# Patient Record
Sex: Male | Born: 1975 | Race: White | Hispanic: No | Marital: Single | Smoking: Never smoker
Health system: Southern US, Community
[De-identification: ages and names within clinical notes are randomized; demographics above are authoritative.]

## PROBLEM LIST (undated history)

## (undated) HISTORY — PX: VASECTOMY: SHX75

---

## 1998-10-10 ENCOUNTER — Encounter: Payer: Self-pay | Admitting: Emergency Medicine

## 1998-10-10 ENCOUNTER — Emergency Department (HOSPITAL_COMMUNITY): Admission: EM | Admit: 1998-10-10 | Discharge: 1998-10-10 | Payer: Self-pay | Admitting: Emergency Medicine

## 2001-06-14 ENCOUNTER — Emergency Department (HOSPITAL_COMMUNITY): Admission: AC | Admit: 2001-06-14 | Discharge: 2001-06-15 | Payer: Self-pay

## 2001-06-14 ENCOUNTER — Encounter: Payer: Self-pay | Admitting: Emergency Medicine

## 2010-01-11 ENCOUNTER — Emergency Department: Payer: Self-pay | Admitting: Emergency Medicine

## 2011-01-06 ENCOUNTER — Emergency Department (HOSPITAL_COMMUNITY): Payer: PRIVATE HEALTH INSURANCE

## 2011-01-06 ENCOUNTER — Observation Stay (HOSPITAL_COMMUNITY): Payer: PRIVATE HEALTH INSURANCE

## 2011-01-06 ENCOUNTER — Inpatient Hospital Stay (HOSPITAL_COMMUNITY)
Admission: EM | Admit: 2011-01-06 | Discharge: 2011-01-07 | DRG: 089 | Disposition: A | Discharge status: Home / Self Care | Payer: PRIVATE HEALTH INSURANCE | Attending: Surgery | Admitting: Surgery

## 2011-01-06 DIAGNOSIS — W138XXA Fall from, out of or through other building or structure, initial encounter: Secondary | ICD-10-CM | POA: Diagnosis present

## 2011-01-06 DIAGNOSIS — S060X1A Concussion with loss of consciousness of 30 minutes or less, initial encounter: Principal | ICD-10-CM | POA: Diagnosis present

## 2011-01-06 DIAGNOSIS — S270XXA Traumatic pneumothorax, initial encounter: Secondary | ICD-10-CM

## 2011-01-06 DIAGNOSIS — S22009A Unspecified fracture of unspecified thoracic vertebra, initial encounter for closed fracture: Secondary | ICD-10-CM | POA: Diagnosis present

## 2011-01-06 DIAGNOSIS — S2239XA Fracture of one rib, unspecified side, initial encounter for closed fracture: Secondary | ICD-10-CM | POA: Diagnosis present

## 2011-01-06 DIAGNOSIS — S27329A Contusion of lung, unspecified, initial encounter: Secondary | ICD-10-CM | POA: Diagnosis present

## 2011-01-06 LAB — POCT I-STAT, CHEM 8
BUN: 23 mg/dL (ref 6–23)
Creatinine, Ser: 1.3 mg/dL (ref 0.50–1.35)
Potassium: 2.9 mEq/L — ABNORMAL LOW (ref 3.5–5.1)
Sodium: 140 mEq/L (ref 135–145)

## 2011-01-06 LAB — URINALYSIS, ROUTINE W REFLEX MICROSCOPIC
Glucose, UA: NEGATIVE mg/dL
Hgb urine dipstick: NEGATIVE
Specific Gravity, Urine: 1.025 (ref 1.005–1.030)

## 2011-01-07 ENCOUNTER — Inpatient Hospital Stay (HOSPITAL_COMMUNITY): Payer: PRIVATE HEALTH INSURANCE

## 2011-01-13 ENCOUNTER — Telehealth (INDEPENDENT_AMBULATORY_CARE_PROVIDER_SITE_OTHER): Payer: Self-pay | Admitting: Physician Assistant

## 2011-01-13 NOTE — Telephone Encounter (Signed)
Gary Cook called for refill of Percocet as they will not be seeing Dr. Phoebe Perch until 01/29/11.  Refilled Percocet 5/325mg  # 80 1-2 po q4hrs prn pain

## 2011-01-13 NOTE — Telephone Encounter (Signed)
Left message

## 2011-01-26 NOTE — Discharge Summary (Signed)
  Gary Cook, Gary Cook                   ACCOUNT NO.:  [id number]  MEDICAL RECORD NO.:  [id number]  LOCATION:  5012                         FACILITY:  [facility]  PHYSICIAN:  Gabrielle Dare. Janee Morn, M.D.DATE OF BIRTH:  02-11-1976  DATE OF ADMISSION:  01/06/2011 DATE OF DISCHARGE:  01/07/2011                              DISCHARGE SUMMARY   ADMITTING TRAUMA SURGEON:  Cherylynn Ridges, MD  CONSULTANTS:  Dr. Phoebe Perch, Neurosurgery.  DISCHARGE DIAGNOSES: 1. Fall from a roof. 2. T10-T12 compression fractures. 3. Question left first rib fracture with small pulmonary contusion and     tiny apical pneumothorax. 4. Concussion with brief loss of consciousness.  HISTORY ON ADMISSION:  This is a 34 year old male who reportedly fell from the roof of a shed approximately 9 feet.  He was brought in as a non-trauma code activation.  Workup at this time including a chest x-ray showed a small left apical pulmonary contusion versus atelectasis.  T and L-spine plain films showed T10-T12 compression fractures.  CT scan of the C-spine showed a left first rib fracture.  Chest, abdomen and pelvic CT scans with detail of the thoracolumbar spine again revealed T10-T12 compression fractures.  The patient was admitted due to inadequate pain control.  He was seen by Dr. Phoebe Perch and mobilized in a TLSO brace.  The patient will follow up with Dr. Phoebe Perch in 3-4 weeks.  The patient did mobilize well with physical therapy and his TLSO brace and at this time is ready for discharge home.  MEDICATIONS:  At the time of discharge include Percocet 5/325 mg one to two p.o. q.4 h. p.r.n. pain #80 no refill and Zofran as needed for nausea and vomiting.  FOLLOWUP:  Again, the patient will follow up with Dr. Phoebe Perch in 3-4 weeks.  Follow up with Trauma Service will be on an as-needed basis.     Shawn Rayburn, P.A.   ______________________________ Gabrielle Dare Janee Morn, M.D.    SR/MEDQ  D:  01/07/2011  T:  01/07/2011  Job:   [id number]  cc:   [facility]  Electronically Signed by Lazaro Arms P.A. on 01/15/2011 05:42:18 PM Electronically Signed by Violeta Gelinas M.D. on 01/26/2011 01:26:33 PM

## 2011-01-27 ENCOUNTER — Telehealth (INDEPENDENT_AMBULATORY_CARE_PROVIDER_SITE_OTHER): Payer: Self-pay | Admitting: Physician Assistant

## 2011-01-27 MED ORDER — OXYCODONE-ACETAMINOPHEN 5-325 MG PO TABS: 1.0000 | ORAL_TABLET | ORAL | Status: AC | PRN

## 2011-01-27 NOTE — Telephone Encounter (Signed)
Gary Cook calls to ask for few more Percocets until he sees Dr. Phoebe Perch on 10/19. I will give Percocet as above #20 No further refills.

## 2014-07-07 ENCOUNTER — Emergency Department: Payer: Self-pay | Admitting: Physician Assistant

## 2017-01-17 ENCOUNTER — Ambulatory Visit (INDEPENDENT_AMBULATORY_CARE_PROVIDER_SITE_OTHER): Payer: Self-pay | Admitting: Physician Assistant

## 2017-01-17 ENCOUNTER — Encounter: Payer: Self-pay | Admitting: Physician Assistant

## 2017-01-17 VITALS — BP 128/66 | HR 78 | Temp 97.8°F | Resp 14 | Ht 69.0 in | Wt 182.0 lb

## 2017-01-17 DIAGNOSIS — G4700 Insomnia, unspecified: Secondary | ICD-10-CM

## 2017-01-17 DIAGNOSIS — Z0000 Encounter for general adult medical examination without abnormal findings: Secondary | ICD-10-CM

## 2017-01-17 DIAGNOSIS — F411 Generalized anxiety disorder: Secondary | ICD-10-CM

## 2017-01-17 MED ORDER — CLONAZEPAM 0.5 MG PO TABS: 0.5000 mg | ORAL_TABLET | Freq: Two times a day (BID) | ORAL | 0 refills | Status: DC | PRN

## 2017-01-17 MED ORDER — ESCITALOPRAM OXALATE 10 MG PO TABS: 10.0000 mg | ORAL_TABLET | Freq: Every day | ORAL | 2 refills | Status: DC

## 2017-01-17 NOTE — Progress Notes (Signed)
Patient ID: Gary Cook MRN: [id number], DOB: 1975-09-02, 41 y.o. Date of Encounter: @  Chief Complaint:  Chief Complaint  Patient presents with  . New Patient~ Establish Care    is not fasting  . Increased Anxiety    HPI: 41 y.o. year old male  presents as a New Patient, as above.   He reports that he "never goes to the doctor ". Notes that his work keeps encouraging wellness visits for health screenings.  Reports that he has been getting very little sleep for several months now. Says that he has no problems falling asleep but is having a lot of difficulty staying asleep. Says that he frequently is awake at 12:00, 2:00, 3:00. Reports that there has been increased stress at work for about 3 months. Reports that his child's mother has recently been diagnosed with cancer. Later in the conversation I asked more about this. He says that she had surgery this past week. Asked what area the cancer is in and whether they think she is going to need additional treatments or just the surgery. He states that she is remarried and thinks that she actually was trying to get pregnant and that's when was found to have cancer "in girl parts." He thinks she is just been a have to have the surgery and hopefully no other treatments.  He is very difficult to talk to and very difficult to get much information from.  I then tried to get more of an idea about his job and his stress at work. I note that he is wearing a shirt that says [employer]. 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 States that some days he is in the office all day and other days he [place] be driving most of the day. Other days it is a mix of office and driving. Says it just depends on the day. Says that he has been with this job for 20 years. I asked about stressors there-- he says that some clients are difficult. Also adds that "they're  always expecting more and more".   I then asked whether he feels that if he was getting enough sleep/ had medication to help with sleep--- if that is the underlying issue or if he feels that there is an underlying problem with anxiety.  He says that frequently here recently he feels like if he doesn't take a deep breath and take a moment that he "will lose it". Says that he does feel anxious and on edge all the time recently. Says this is new for him just over the past 3 months. Says that he used to be the person that would make other people laugh.  Says now he really doesn't want to go to work. Dreads going to work.  No other concerns to address today.   History reviewed. No pertinent past medical history.   Home Meds: No outpatient prescriptions prior to visit.   No facility-administered medications prior to visit.     Allergies: No Known Allergies  Social History   Social History  . Marital status: Unknown    Spouse name: N/A  . Number of children: N/A  . Years of education: N/A   Occupational History  . Not on file.   Social History Main Topics  . Smoking status: Never Smoker  . Smokeless tobacco: Never Used  . Alcohol use 3.6 oz/week    6 Cans of beer  per week  . Drug use: No  . Sexual activity: Yes    Birth control/ protection: Surgical   Other Topics Concern  . Not on file   Social History Narrative  . No narrative on file    Family History  Problem Relation Age of Onset  . Cancer Mother        breast  . Arthritis Father   . Cancer Father        prostate/ leukemia  . Arthritis Paternal Grandmother      Review of Systems:  See HPI for pertinent ROS. All other ROS negative.    Physical Exam: Blood pressure 128/66, pulse 78, temperature 97.8 F (36.6 C), temperature source Oral, resp. rate 14, height  (1.753 m), weight 82.6 kg (182 lb), SpO2 96 %., Body mass index is 26.88 kg/m. General: WNWD WM. Appears in no acute distress. Neck: Supple. No  thyromegaly. No lymphadenopathy. Lungs: Clear bilaterally to auscultation without wheezes, rales, or rhonchi. Breathing is unlabored. Heart: RRR with S1 S2. No murmurs, rubs, or gallops. Musculoskeletal:  Strength and tone normal for age. Extremities/Skin: Warm and dry.  Neuro: Alert and oriented X 3. Moves all extremities spontaneously. Gait is normal. CNII-XII grossly in tact. Psych:  Responds to questions appropriately with a normal affect. Very stoic. Very difficult to get information from. Keeps saying "yes mam" to me.      ASSESSMENT AND PLAN:  41 y.o. year old male with  1. Encounter for medical examination to establish care Once we get his anxiety and insomnia controlled, will discuss scheduling CPE.  2. Generalized anxiety disorder Discussed proper expectations of these medications and proper use of them. Discussed that the Lexapro will take weeks to take effect. Therefore if he does not think that it is necessarily helping to just continue taking it until he follows up with me in 6 weeks. However, if he thinks that this is causing adverse effects then stop the medication and go ahead and call and inform me. Discussed proper expectations and use of the Klonopin. He is to take 1 every night to see if this helps with insomnia. He is to have trial for 2 or 3 nights. After that if he is still not getting sleep he is to go ahead and call me so I can prescribe a different medication such as Ambien to use for sleep. He can also use this during the day as needed for significant anxiety symptoms. Discussed not to take prior to driving and to take trial dose at home to see how it affects him prior to taking this at work etc. He voices understanding and agrees regarding proper use of these medications and to go ahead and call me if he thinks they're causing any adverse effects or if he is not getting adequate sleep in the next few days. - escitalopram (LEXAPRO) 10 MG tablet; Take 1 tablet (10 mg  total) by mouth daily.  Dispense: 30 tablet; Refill: 2 - clonazePAM (KLONOPIN) 0.5 MG tablet; Take 1 tablet (0.5 mg total) by mouth 2 (two) times daily as needed for anxiety.  Dispense: 60 tablet; Refill: 0  3. Insomnia, unspecified type - clonazePAM (KLONOPIN) 0.5 MG tablet; Take 1 tablet (0.5 mg total) by mouth 2 (two) times daily as needed for anxiety.  Dispense: 60 tablet; Refill: 0  Follow-up visit in 6 weeks or sooner if needed. Once I get anxiety and insomnia controlled and will discuss scheduling CPE.  Signed, Shon [place], [place], [place]  01/17/2017 4:30 PM

## 2017-02-28 ENCOUNTER — Ambulatory Visit (INDEPENDENT_AMBULATORY_CARE_PROVIDER_SITE_OTHER): Payer: 59 | Admitting: Physician Assistant

## 2017-02-28 ENCOUNTER — Other Ambulatory Visit: Payer: Self-pay

## 2017-02-28 ENCOUNTER — Encounter: Payer: Self-pay | Admitting: Physician Assistant

## 2017-02-28 VITALS — BP 120/84 | HR 88 | Temp 97.6°F | Resp 16 | Wt 193.6 lb

## 2017-02-28 DIAGNOSIS — G4700 Insomnia, unspecified: Secondary | ICD-10-CM

## 2017-02-28 DIAGNOSIS — F411 Generalized anxiety disorder: Secondary | ICD-10-CM | POA: Diagnosis not present

## 2017-02-28 MED ORDER — CLONAZEPAM 0.5 MG PO TABS: 0.5000 mg | ORAL_TABLET | Freq: Two times a day (BID) | ORAL | 0 refills | Status: DC | PRN

## 2017-02-28 MED ORDER — ESCITALOPRAM OXALATE 20 MG PO TABS: 20.0000 mg | ORAL_TABLET | Freq: Every day | ORAL | 1 refills | Status: DC

## 2017-02-28 NOTE — Progress Notes (Signed)
Patient ID: Iniko Mccay MRN: [id number], DOB: July 22, 1975, 41 y.o. Date of Encounter: @DATE @  Chief Complaint:  Chief Complaint  Patient presents with  . 6 week follow up    HPI: 41 y.o. year old male     01/17/2017:  presents as a New Patient, as above.   He reports that he "never goes to the doctor ". Notes that his work keeps encouraging wellness visits for health screenings.  Reports that he has been getting very little sleep for several months now. Says that he has no problems falling asleep but is having a lot of difficulty staying asleep. Says that he frequently is awake at 12:00, 2:00, 3:00. Reports that there has been increased stress at work for about 3 months. Reports that his child's mother has recently been diagnosed with cancer. Later in the conversation I asked more about this. He says that she had surgery this past week. Asked what area the cancer is in and whether they think she is going to need additional treatments or just the surgery. He states that she is remarried and thinks that she actually was trying to get pregnant and that's when was found to have cancer "in girl parts." He thinks she is just been a have to have the surgery and hopefully no other treatments.  He is very difficult to talk to and very difficult to get much information from.  I then tried to get more of an idea about his job and his stress at work. I note that he is wearing a shirt that says [employer]. 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 States that some days he is in the office all day and other days he [place] be driving most of the day. Other days it is a mix of office and driving. Says it just depends on the day. Says that he has been with this job for 20 years. I asked about stressors there-- he says that some clients are difficult. Also adds that "they're always expecting more and more".    I then asked whether he feels that if he was getting enough sleep/ had medication to help with sleep--- if that is the underlying issue or if he feels that there is an underlying problem with anxiety.  He says that frequently here recently he feels like if he doesn't take a deep breath and take a moment that he "will lose it". Says that he does feel anxious and on edge all the time recently. Says this is new for him just over the past 3 months. Says that he used to be the person that would make other people laugh.  Says now he really doesn't want to go to work. Dreads going to work.  No other concerns to address today.  AT THAT OV: I prescribed Lexapro 10 mg daily.  Also prescribed clonazepam 0.5 mg 1 p.o. twice daily as needed.  60+0.    02/28/2017: Today patient states that he definitely is feeling better than he did 6 weeks ago at last visit. States that he has been sleeping better.  Does still wake up some but not as much.  He does not wake as often-- and also does not stay awake as long as he was in past---is able to fall back to sleep. Says that in the morning he takes the Lexapro and also goes ahead and takes the clonazepam along with it.  Feels like if he did not take the Klonopin, he would still feel anxious/on edge so has been taking that each morning to prevent getting so upset/stressed out.  Has not been taking Klonopin at night.  He feels that he is sleeping better just because of the Lexapro.  Has been on vacation for the past 10 days so has been off work but is returning to work today.  Lexapro is causing no adverse effects.    No past medical history on file.   Home Meds: Outpatient Medications Prior to Visit  Medication Sig Dispense Refill  . Melatonin 5 MG TABS Take 5 mg by mouth.     . Multiple Vitamin (MULTIVITAMIN WITH MINERALS) TABS tablet Take 1 tablet by mouth daily.    . clonazePAM (KLONOPIN) 0.5 MG tablet Take 1 tablet (0.5 mg total) by mouth 2 (two) times daily  as needed for anxiety. 60 tablet 0  . escitalopram (LEXAPRO) 10 MG tablet Take 1 tablet (10 mg total) by mouth daily. 30 tablet 2   No facility-administered medications prior to visit.     Allergies: No Known Allergies  Social History   Socioeconomic History  . Marital status: Unknown    Spouse name: Not on file  . Number of children: Not on file  . Years of education: Not on file  . Highest education level: Not on file  Social Needs  . Financial resource strain: Not on file  . Food insecurity - worry: Not on file  . Food insecurity - inability: Not on file  . Transportation needs - medical: Not on file  . Transportation needs - non-medical: Not on file  Occupational History  . Not on file  Tobacco Use  . Smoking status: Never Smoker  . Smokeless tobacco: Never Used  Substance and Sexual Activity  . Alcohol use: Yes    Alcohol/week: 3.6 oz    Types: 6 Cans of beer per week  . Drug use: No  . Sexual activity: Yes    Birth control/protection: Surgical  Other Topics Concern  . Not on file  Social History Narrative  . Not on file    Family History  Problem Relation Age of Onset  . Cancer Mother        breast  . Arthritis Father   . Cancer Father        prostate/ leukemia  . Arthritis Paternal Grandmother      Review of Systems:  See HPI for pertinent ROS. All other ROS negative.    Physical Exam: Blood pressure 120/84, pulse 88, temperature 97.6 F (36.4 C), temperature source Oral, resp. rate 16, weight 87.8 kg (193 lb 9.6 oz), SpO2 98 %., Body mass index is 28.59 kg/m. General: WNWD WM. Appears in no acute distress. Neck: Supple. No thyromegaly. No lymphadenopathy. Lungs: Clear bilaterally to auscultation without wheezes, rales, or rhonchi. Breathing is unlabored. Heart: RRR with S1 S2. No murmurs, rubs, or gallops. Musculoskeletal:  Strength and tone normal for age. Extremities/Skin: Warm and dry.  Neuro: Alert and oriented X 3. Moves all extremities  spontaneously. Gait is normal. CNII-XII grossly in tact. Psych:  Responds to questions appropriately with a normal affect. Very stoic. Very difficult to get information from. Keeps saying "yes mam" to me.      ASSESSMENT AND PLAN:  41 y.o. year old male with   1. Insomnia, unspecified type Increase Lexapro to 20 mg daily.  Continue taking Klonopin as needed.  Follow-up visit 6-8 weeks. - escitalopram (  LEXAPRO) 20 MG tablet; Take 1 tablet (20 mg total) daily by mouth.  Dispense: 30 tablet; Refill: 1 - clonazePAM (KLONOPIN) 0.5 MG tablet; Take 1 tablet (0.5 mg total) 2 (two) times daily as needed by mouth for anxiety.  Dispense: 60 tablet; Refill: 0  2. Generalized anxiety disorder Increase Lexapro to 20 mg daily.  Continue taking Klonopin as needed.  Follow-up visit 6-8 weeks. - escitalopram (LEXAPRO) 20 MG tablet; Take 1 tablet (20 mg total) daily by mouth.  Dispense: 30 tablet; Refill: 1 - clonazePAM (KLONOPIN) 0.5 MG tablet; Take 1 tablet (0.5 mg total) 2 (two) times daily as needed by mouth for anxiety.  Dispense: 60 tablet; Refill: 0    Once we get his anxiety and insomnia controlled, will discuss scheduling CPE.  Follow-up visit in 6 weeks or sooner if needed. Once I get anxiety and insomnia controlled and will discuss scheduling CPE.  Signed, [address]Ariana Juul Beth Oakv[place], [place], [facility] 02/28/2017 11:48 AM

## 2017-04-11 ENCOUNTER — Ambulatory Visit: Payer: 59 | Admitting: Physician Assistant

## 2018-05-03 ENCOUNTER — Telehealth: Payer: Self-pay | Admitting: Family Medicine

## 2018-05-03 NOTE — Telephone Encounter (Signed)
Pt has shingles and is being treated with Valtrex by teledoc but would like something for the pain.

## 2018-05-04 ENCOUNTER — Other Ambulatory Visit: Payer: Self-pay | Admitting: Family Medicine

## 2018-05-04 MED ORDER — HYDROCODONE-ACETAMINOPHEN 5-325 MG PO TABS: 1.0000 | ORAL_TABLET | Freq: Four times a day (QID) | ORAL | 0 refills | Status: DC | PRN

## 2018-05-04 NOTE — Telephone Encounter (Signed)
Pt aware.

## 2018-05-04 NOTE — Telephone Encounter (Signed)
I sent norco to his pharmacy.

## 2018-10-05 ENCOUNTER — Encounter: Payer: Self-pay | Admitting: Family Medicine

## 2018-10-05 ENCOUNTER — Ambulatory Visit (INDEPENDENT_AMBULATORY_CARE_PROVIDER_SITE_OTHER): Payer: Commercial Managed Care - PPO | Admitting: Family Medicine

## 2018-10-05 ENCOUNTER — Other Ambulatory Visit: Payer: Self-pay

## 2018-10-05 VITALS — BP 112/66 | HR 76 | Temp 98.3°F | Resp 14 | Ht 69.0 in | Wt 194.0 lb

## 2018-10-05 DIAGNOSIS — B0229 Other postherpetic nervous system involvement: Secondary | ICD-10-CM | POA: Diagnosis not present

## 2018-10-05 MED ORDER — VALACYCLOVIR HCL 1 G PO TABS: 1000.0000 mg | ORAL_TABLET | Freq: Three times a day (TID) | ORAL | 0 refills | Status: DC

## 2018-10-05 MED ORDER — GABAPENTIN 300 MG PO CAPS: 300.0000 mg | ORAL_CAPSULE | Freq: Three times a day (TID) | ORAL | 3 refills | Status: DC

## 2018-10-05 NOTE — Progress Notes (Signed)
Subjective:    Patient ID: Gary Cook, male    DOB: 05/28/1975, 43 y.o.   MRN: [id number]  HPI  Patient had shingles in a dermatomal distribution on his right flank roughly in the area of the kidney radiating around to his right abdomen in January.  Patient states it was severe.  It was a red rash with numerous blisters that burn and hurt.  He states that that was the second time he had had "shingles" in that exact same area.  He states that 3 days ago he started having the same symptoms of burning stinging pain in that area.  There is no visible rash.  The area diagrammed on the physical exam represents the distribution of the pain  No past medical history on file. Past Surgical History:  Procedure Laterality Date  . VASECTOMY     Current Outpatient Medications on File Prior to Visit  Medication Sig Dispense Refill  . clonazePAM (KLONOPIN) 0.5 MG tablet Take 1 tablet (0.5 mg total) 2 (two) times daily as needed by mouth for anxiety. 60 tablet 0  . escitalopram (LEXAPRO) 20 MG tablet Take 1 tablet (20 mg total) daily by mouth. 30 tablet 1  . HYDROcodone-acetaminophen (NORCO) 5-325 MG tablet Take 1 tablet by mouth every 6 (six) hours as needed for moderate pain. 30 tablet 0  . Melatonin 5 MG TABS Take 5 mg by mouth.     . Multiple Vitamin (MULTIVITAMIN WITH MINERALS) TABS tablet Take 1 tablet by mouth daily.     No current facility-administered medications on file prior to visit.    No Known Allergies Social History   Socioeconomic History  . Marital status: Unknown    Spouse name: Not on file  . Number of children: Not on file  . Years of education: Not on file  . Highest education level: Not on file  Occupational History  . Not on file  Social Needs  . Financial resource strain: Not on file  . Food insecurity    Worry: Not on file    Inability: Not on file  . Transportation needs    Medical: Not on file    Non-medical: Not on file  Tobacco Use  . Smoking status: Never  Smoker  . Smokeless tobacco: Never Used  Substance and Sexual Activity  . Alcohol use: Yes    Alcohol/week: 6.0 standard drinks    Types: 6 Cans of beer per week  . Drug use: No  . Sexual activity: Yes    Birth control/protection: Surgical  Lifestyle  . Physical activity    Days per week: Not on file    Minutes per session: Not on file  . Stress: Not on file  Relationships  . Social Musicianconnections    Talks on phone: Not on file    Gets together: Not on file    Attends religious service: Not on file    Active member of club or organization: Not on file    Attends meetings of clubs or organizations: Not on file    Relationship status: Not on file  . Intimate partner violence    Fear of current or ex partner: Not on file    Emotionally abused: Not on file    Physically abused: Not on file    Forced sexual activity: Not on file  Other Topics Concern  . Not on file  Social History Narrative  . Not on file     Review of Systems  All other systems  reviewed and are negative.      Objective:   Physical Exam Vitals signs reviewed.  Constitutional:      Appearance: Normal appearance.  Cardiovascular:     Rate and Rhythm: Normal rate and regular rhythm.     Pulses: Normal pulses.     Heart sounds: Normal heart sounds. No murmur.  Pulmonary:     Effort: Pulmonary effort is normal. No respiratory distress.     Breath sounds: Normal breath sounds. No stridor. No wheezing, rhonchi or rales.  Abdominal:     General: Abdomen is flat. Bowel sounds are normal.     Palpations: Abdomen is soft.    Musculoskeletal:       Back:     Right lower leg: No edema.     Left lower leg: No edema.  Neurological:     Mental Status: He is alert.           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 Patient has no visible rash but pain that he describes certainly sounds neuropathic.  This is either postherpetic neuralgia where the patient is about to suffer  another outbreak.  We will treat the patient with Valtrex 1 g p.o. 3 times daily for 7 days with gabapentin.  The patient continues to get outbreaks in the same area I would recommend a viral culture to differentiate between shingles versus herpes.  If there is no rash with the patient continues to experience pain in that area I will treat him simply with gabapentin in the future for postherpetic neuralgia.  Spent 20 minutes with the patient today explaining this and the difference.

## 2019-09-11 ENCOUNTER — Ambulatory Visit
Admission: RE | Admit: 2019-09-11 | Discharge: 2019-09-11 | Disposition: A | Discharge status: Home / Self Care | Payer: Commercial Managed Care - PPO | Source: Ambulatory Visit | Attending: Family Medicine | Admitting: Family Medicine

## 2019-09-11 ENCOUNTER — Other Ambulatory Visit: Payer: Self-pay

## 2019-09-11 ENCOUNTER — Ambulatory Visit (INDEPENDENT_AMBULATORY_CARE_PROVIDER_SITE_OTHER): Payer: Commercial Managed Care - PPO | Admitting: Family Medicine

## 2019-09-11 VITALS — BP 134/70 | HR 76 | Temp 97.1°F | Resp 14 | Ht 69.0 in | Wt 202.0 lb

## 2019-09-11 DIAGNOSIS — M5432 Sciatica, left side: Secondary | ICD-10-CM | POA: Diagnosis not present

## 2019-09-11 NOTE — Progress Notes (Signed)
Subjective:    Patient ID: Gary Cook, male    DOB: 08-28-1975, 44 y.o.   MRN: [id number]  HPI  Patient is a very pleasant 44 year old Caucasian male who presents today complaining of left-sided sciatica.  About 10 years ago, he fell off the roof of a barn and injured himself severely when he hit the ground.  He never had any x-rays taken of his lower back.  Since that time he has had occasional pain radiating down his left leg.  The pain starts in his gluteus area and radiates all the way into his left foot.  Over the last several months however the pain has become intense.  He states that a week ago he was unable to leave the house due to the pain in his lower back radiating down his left leg.  He is carrying his weight awkwardly on his right leg to try to compensate and this is leading to pain over the lateral aspect of his right foot particular over the metatarsal and arch.  He describes a shooting burning pain going down his left leg.  He denies any numbness in his crotch or saddle anesthesia.  He denies any bowel or bladder incontinence.  He denies any rash down his leg.  He has been taking gabapentin which I initially gave him for shingles for the pain and it does not help No past medical history on file. Past Surgical History:  Procedure Laterality Date  . VASECTOMY     Current Outpatient Medications on File Prior to Visit  Medication Sig Dispense Refill  . gabapentin (NEURONTIN) 300 MG capsule Take 1 capsule (300 mg total) by mouth 3 (three) times daily. 90 capsule 3  . HYDROcodone-acetaminophen (NORCO) 5-325 MG tablet Take 1 tablet by mouth every 6 (six) hours as needed for moderate pain. (Patient not taking: Reported on 10/05/2018) 30 tablet 0  . Multiple Vitamin (MULTIVITAMIN WITH MINERALS) TABS tablet Take 1 tablet by mouth daily.    . valACYclovir (VALTREX) 1000 MG tablet Take 1 tablet (1,000 mg total) by mouth 3 (three) times daily. 21 tablet 0   No current facility-administered  medications on file prior to visit.   No Known Allergies Social History   Socioeconomic History  . Marital status: Unknown    Spouse name: Not on file  . Number of children: Not on file  . Years of education: Not on file  . Highest education level: Not on file  Occupational History  . Not on file  Tobacco Use  . Smoking status: Never Smoker  . Smokeless tobacco: Never Used  Substance and Sexual Activity  . Alcohol use: Yes    Alcohol/week: 6.0 standard drinks    Types: 6 Cans of beer per week  . Drug use: No  . Sexual activity: Yes    Birth control/protection: Surgical  Other Topics Concern  . Not on file  Social History Narrative  . Not on file   Social Determinants of Health   Financial Resource Strain:   . Difficulty of Paying Living Expenses:   Food Insecurity:   . Worried About Programme researcher, broadcasting/film/video in the Last Year:   . Barista in the Last Year:   Transportation Needs:   . Freight forwarder (Medical):   Marland Kitchen Lack of Transportation (Non-Medical):   Physical Activity:   . Days of Exercise per Week:   . Minutes of Exercise per Session:   Stress:   . Feeling of Stress :  Social Connections:   . Frequency of Communication with Friends and Family:   . Frequency of Social Gatherings with Friends and Family:   . Attends Religious Services:   . Active Member of Clubs or Organizations:   . Attends Archivist Meetings:   Marland Kitchen Marital Status:   Intimate Partner Violence:   . Fear of Current or Ex-Partner:   . Emotionally Abused:   Marland Kitchen Physically Abused:   . Sexually Abused:      Review of Systems  All other systems reviewed and are negative.      Objective:   Physical Exam Vitals reviewed.  Constitutional:      Appearance: Normal appearance.  Cardiovascular:     Rate and Rhythm: Normal rate and regular rhythm.     Pulses: Normal pulses.     Heart sounds: Normal heart sounds. No murmur.  Pulmonary:     Effort: Pulmonary effort is normal.  No respiratory distress.     Breath sounds: Normal breath sounds. No stridor. No wheezing, rhonchi or rales.  Abdominal:     General: Abdomen is flat. Bowel sounds are normal.     Palpations: Abdomen is soft.  Musculoskeletal:     Right lower leg: Tenderness present. No deformity, lacerations or bony tenderness. No edema.     Left lower leg: No edema.       Legs:       Feet:  Neurological:     Mental Status: He is alert.           Assessment & Plan:  Left sided sciatica - Plan: DG Lumbar Spine Complete  I honestly believe the pain in his right foot is due to him compensating for the pain in his left leg.  The pattern of the pain in his left leg sounds like sciatica.  I suspect from herniated disc in his lower back.  Begin by obtaining an x-ray of his lumbar spine.  If the x-ray of the lumbar spine shows degenerative disc disease as I anticipate, I will start the patient on a prednisone taper pack and then transition to meloxicam 15 mg a day.  We can increase the strength of the gabapentin to help with the sciatica.  If this is not working we could even consider epidural steroid injections and an MRI of the lumbar spine.  Await the results of the x-ray.

## 2019-09-12 ENCOUNTER — Other Ambulatory Visit: Payer: Self-pay | Admitting: Family Medicine

## 2019-09-12 MED ORDER — MELOXICAM 15 MG PO TABS: 15.0000 mg | ORAL_TABLET | Freq: Every day | ORAL | 0 refills | Status: DC

## 2019-09-12 MED ORDER — PREDNISONE 20 MG PO TABS: ORAL_TABLET | ORAL | 0 refills | Status: DC

## 2019-09-13 NOTE — Telephone Encounter (Signed)
Last office visit: 09/11/2019 Last refilled:  10/05/2018

## 2019-10-22 ENCOUNTER — Other Ambulatory Visit: Payer: Self-pay

## 2019-10-22 MED ORDER — GABAPENTIN 300 MG PO CAPS: 300.0000 mg | ORAL_CAPSULE | Freq: Three times a day (TID) | ORAL | 2 refills | Status: DC

## 2019-10-22 MED ORDER — MELOXICAM 15 MG PO TABS: 15.0000 mg | ORAL_TABLET | Freq: Every day | ORAL | 0 refills | Status: DC

## 2019-10-22 MED ORDER — HYDROCODONE-ACETAMINOPHEN 5-325 MG PO TABS: 1.0000 | ORAL_TABLET | Freq: Four times a day (QID) | ORAL | 0 refills | Status: DC | PRN

## 2019-10-22 NOTE — Telephone Encounter (Signed)
Gabapentin last refilled 09/13/19 Meloxicam last refilled 09/13/19 Hydrocodone last refilled 05/01/18

## 2019-10-22 NOTE — Telephone Encounter (Signed)
Gabapentin last refill 09/13/19 Meloxicam last refill 09/13/19 Hydrocodone last refill 05/01/18

## 2020-09-26 ENCOUNTER — Encounter: Payer: Self-pay | Admitting: Family Medicine

## 2020-09-26 ENCOUNTER — Ambulatory Visit (INDEPENDENT_AMBULATORY_CARE_PROVIDER_SITE_OTHER): Payer: Commercial Managed Care - PPO | Admitting: Family Medicine

## 2020-09-26 ENCOUNTER — Other Ambulatory Visit: Payer: Self-pay

## 2020-09-26 VITALS — BP 130/76 | HR 88 | Temp 97.9°F | Resp 16 | Ht 69.0 in | Wt 202.0 lb

## 2020-09-26 DIAGNOSIS — M5442 Lumbago with sciatica, left side: Secondary | ICD-10-CM

## 2020-09-26 DIAGNOSIS — M5441 Lumbago with sciatica, right side: Secondary | ICD-10-CM | POA: Diagnosis not present

## 2020-09-26 DIAGNOSIS — G8929 Other chronic pain: Secondary | ICD-10-CM

## 2020-09-26 MED ORDER — PREDNISONE 20 MG PO TABS
ORAL_TABLET | ORAL | 0 refills | Status: DC
Start: 2020-09-26 — End: 2020-10-06

## 2020-09-26 NOTE — Progress Notes (Signed)
Subjective:    Patient ID: Gary Cook, male    DOB: 09-11-1975, 45 y.o.   MRN: [id number]  HPI  09/2019 Patient is a very pleasant 45 year old Caucasian male who presents today complaining of left-sided sciatica.  About 10 years ago, he fell off the roof of a barn and injured himself severely when he hit the ground.  He never had any x-rays taken of his lower back.  Since that time he has had occasional pain radiating down his left leg.  The pain starts in his gluteus area and radiates all the way into his left foot.  Over the last several months however the pain has become intense.  He states that a week ago he was unable to leave the house due to the pain in his lower back radiating down his left leg.  He is carrying his weight awkwardly on his right leg to try to compensate and this is leading to pain over the lateral aspect of his right foot particular over the metatarsal and arch.  He describes a shooting burning pain going down his left leg.  He denies any numbness in his crotch or saddle anesthesia.  He denies any bowel or bladder incontinence.  He denies any rash down his leg.  He has been taking gabapentin which I initially gave him for shingles for the pain and it does not help.  At that time, my plan was:  I honestly believe the pain in his right foot is due to him compensating for the pain in his left leg.  The pattern of the pain in his left leg sounds like sciatica.  I suspect from herniated disc in his lower back.  Begin by obtaining an x-ray of his lumbar spine.  If the x-ray of the lumbar spine shows degenerative disc disease as I anticipate, I will start the patient on a prednisone taper pack and then transition to meloxicam 15 mg a day.  We can increase the strength of the gabapentin to help with the sciatica.  If this is not working we could even consider epidural steroid injections and an MRI of the lumbar spine.  Await the results of the x-ray.  09/26/20 Patient presents today  complaining of chronic lower back pain.  Is been present for more than a year.  He states that it starts in his lower back and will radiate into his left leg all the way down sometimes to his left foot.  The pain has become constant and chronic.  He tried meloxicam without any benefit.  He denies any saddle anesthesia or bowel or bladder incontinence.  He denies any leg weakness however the pain is become so severe that is compromising his quality of life.  We saw him for a similar issue last year.  At that time we did an x-ray of the lumbar spine which revealed only mild degenerative disc disease at L1-L2 but was otherwise relatively normal.  His pain seems to be more initiating around L4-L5 and certainly has a neuropathic component History reviewed. No pertinent past medical history. Past Surgical History:  Procedure Laterality Date   VASECTOMY     Current Outpatient Medications on File Prior to Visit  Medication Sig Dispense Refill   Multiple Vitamin (MULTIVITAMIN WITH MINERALS) TABS tablet Take 1 tablet by mouth daily.     No current facility-administered medications on file prior to visit.   No Known Allergies Social History   Socioeconomic History   Marital status: Unknown  Spouse name: Not on file   Number of children: Not on file   Years of education: Not on file   Highest education level: Not on file  Occupational History   Not on file  Tobacco Use   Smoking status: Never   Smokeless tobacco: Never  Vaping Use   Vaping Use: Never used  Substance and Sexual Activity   Alcohol use: Yes    Alcohol/week: 6.0 standard drinks    Types: 6 Cans of beer per week   Drug use: No   Sexual activity: Yes    Birth control/protection: Surgical  Other Topics Concern   Not on file  Social History Narrative   Not on file   Social Determinants of Health   Financial Resource Strain: Not on file  Food Insecurity: Not on file  Transportation Needs: Not on file  Physical Activity: Not  on file  Stress: Not on file  Social Connections: Not on file  Intimate Partner Violence: Not on file     Review of Systems     Objective:   Physical Exam Vitals reviewed.  Constitutional:      Appearance: Normal appearance.  Cardiovascular:     Rate and Rhythm: Normal rate and regular rhythm.     Pulses: Normal pulses.     Heart sounds: Normal heart sounds. No murmur heard. Pulmonary:     Effort: Pulmonary effort is normal. No respiratory distress.     Breath sounds: Normal breath sounds. No stridor. No wheezing, rhonchi or rales.  Abdominal:     General: Abdomen is flat. Bowel sounds are normal.     Palpations: Abdomen is soft.  Musculoskeletal:     Right lower leg: No edema.     Left lower leg: No edema.       Legs:  Neurological:     Mental Status: He is alert.          Assessment & Plan:  Chronic left-sided low back pain with bilateral sciatica - Plan: MR Lumbar Spine Wo Contrast Obtain an MRI of the lumbar spine.  I suspect a herniated disc at L4-L5.  Begin prednisone taper pack meanwhile.  Await the results of the MRI.  Likely will need neurosurgery consultation.

## 2020-10-06 ENCOUNTER — Telehealth: Payer: Self-pay | Admitting: *Deleted

## 2020-10-06 MED ORDER — PREDNISONE 20 MG PO TABS: ORAL_TABLET | ORAL | 0 refills | Status: DC

## 2020-10-06 NOTE — Telephone Encounter (Signed)
Call placed to patient and patient made aware.   States that he has done PT in the past for injury and remembers the stretches. States that he would like to try Pred taper again while waiting on MRI.   Of note, message sent to referral coordinator to expedite MRI.

## 2020-10-06 NOTE — Telephone Encounter (Signed)
Received call from patient.   Reports that he has completed pred taper. States that while on taper, pain in back and leg was improved, but has returned after completion.   Plan of care from 09/26/2020: Obtain an MRI of the lumbar spine.  I suspect a herniated disc at L4-L5.  Begin prednisone taper pack meanwhile.  Await the results of the MRI.  Likely will need neurosurgery consultation.  MRI has not been scheduled at this time. Contacted referral coordinator to inquire.   Patient states that he has been taking Alee, but it is only mildly helpful. Please advise.

## 2020-10-10 ENCOUNTER — Ambulatory Visit
Admission: RE | Admit: 2020-10-10 | Discharge: 2020-10-10 | Disposition: A | Discharge status: Home / Self Care | Payer: Commercial Managed Care - PPO | Source: Ambulatory Visit | Attending: Family Medicine | Admitting: Family Medicine

## 2020-10-10 DIAGNOSIS — M5442 Lumbago with sciatica, left side: Secondary | ICD-10-CM

## 2020-10-15 ENCOUNTER — Other Ambulatory Visit: Payer: Self-pay | Admitting: *Deleted

## 2020-10-15 DIAGNOSIS — M5442 Lumbago with sciatica, left side: Secondary | ICD-10-CM

## 2020-10-15 DIAGNOSIS — M5432 Sciatica, left side: Secondary | ICD-10-CM

## 2020-10-22 ENCOUNTER — Telehealth: Payer: Self-pay | Admitting: *Deleted

## 2020-10-22 NOTE — Telephone Encounter (Signed)
Received call from patient.   Reports that he has completed prednisone taper x2 with no relief. States that he had MRI and is awaiting neurology consult for nerve conduction studies.   Inquired as to pain management. Please advise.

## 2020-10-23 MED ORDER — GABAPENTIN 300 MG PO CAPS: 300.0000 mg | ORAL_CAPSULE | Freq: Three times a day (TID) | ORAL | 3 refills | Status: AC

## 2020-10-23 NOTE — Telephone Encounter (Signed)
Call placed to patient and patient made aware.   Prescription sent to pharmacy.  

## 2020-11-24 ENCOUNTER — Telehealth: Payer: Self-pay | Admitting: *Deleted

## 2020-11-24 NOTE — Telephone Encounter (Signed)
Received call from patient.   Reports that he has increased back pain. States that he as completed pred taper x2 and is using Gabapentin with little to no relief.   Requesting prescription for pain relief.   States that he was in so much pain he could not work today.   Please advise.

## 2020-11-25 ENCOUNTER — Other Ambulatory Visit: Payer: Self-pay | Admitting: Family Medicine

## 2020-11-25 MED ORDER — HYDROCODONE-ACETAMINOPHEN 5-325 MG PO TABS: 1.0000 | ORAL_TABLET | Freq: Four times a day (QID) | ORAL | 0 refills | Status: AC | PRN

## 2020-11-25 MED ORDER — MELOXICAM 15 MG PO TABS: 15.0000 mg | ORAL_TABLET | Freq: Every day | ORAL | 0 refills | Status: AC

## 2020-11-25 NOTE — Telephone Encounter (Signed)
Call placed to patient. LMTRC.  

## 2020-11-25 NOTE — Telephone Encounter (Signed)
Call placed to patient and patient made aware.  

## 2020-12-03 ENCOUNTER — Ambulatory Visit (INDEPENDENT_AMBULATORY_CARE_PROVIDER_SITE_OTHER): Payer: Commercial Managed Care - PPO | Admitting: Diagnostic Neuroimaging

## 2020-12-03 ENCOUNTER — Encounter: Payer: Self-pay | Admitting: Diagnostic Neuroimaging

## 2020-12-03 VITALS — BP 116/75 | HR 77 | Ht 69.0 in | Wt 209.0 lb

## 2020-12-03 DIAGNOSIS — G8929 Other chronic pain: Secondary | ICD-10-CM

## 2020-12-03 DIAGNOSIS — M5442 Lumbago with sciatica, left side: Secondary | ICD-10-CM

## 2020-12-03 DIAGNOSIS — S22070S Wedge compression fracture of T9-T10 vertebra, sequela: Secondary | ICD-10-CM | POA: Diagnosis not present

## 2020-12-03 NOTE — Progress Notes (Signed)
[facility]  PATIENT: Gary Cook DOB: 02-16-1976  REFERRING CLINICIAN: Donita Brooks, MD HISTORY FROM: patient  REASON FOR VISIT: new consult    HISTORICAL  CHIEF COMPLAINT:  Chief Complaint  Patient presents with   Chronic back pain    Rm 6 New Pt, int referral "hx of fall 10 years ago; Gabapentin not helping; hydrocodone, mobic helped me move around again"     HISTORY OF PRESENT ILLNESS:   45 year old male here for evaluation of low back pain, left-sided sciatica.  Evaluate for EMG nerve conduction study.  Patient had severe traumatic injury in 2012 when he was on a barn and fell approximately 17 feet from the air onto the ground.  He was admitted to the hospital was found to have multiple compression fractures, rib fracture and pulmonary contusion.  Patient gradually improved over time but has had a chronic pain ever since then.  Symptoms have worsened in the last few months.  He has been to a chiropractor few weeks ago and the treatment seem to be helping his pain and mobility.    REVIEW OF SYSTEMS: Full 14 system review of systems performed and negative with exception of: as per HPI.  ALLERGIES: No Known Allergies  HOME MEDICATIONS: Outpatient Medications Prior to Visit  Medication Sig Dispense Refill   gabapentin (NEURONTIN) 300 MG capsule Take 1 capsule (300 mg total) by mouth 3 (three) times daily. 90 capsule 3   HYDROcodone-acetaminophen (NORCO) 5-325 MG tablet Take 1 tablet by mouth every 6 (six) hours as needed for moderate pain. 30 tablet 0   meloxicam (MOBIC) 15 MG tablet Take 1 tablet (15 mg total) by mouth daily. 30 tablet 0   Multiple Vitamin (MULTIVITAMIN WITH MINERALS) TABS tablet Take 1 tablet by mouth daily.     No facility-administered medications prior to visit.    PAST MEDICAL HISTORY: No past medical history on file.  PAST SURGICAL HISTORY: Past Surgical History:  Procedure Laterality Date   VASECTOMY      FAMILY  HISTORY: Family History  Problem Relation Age of Onset   Cancer Mother        breast   Arthritis Father    Cancer Father        prostate/ leukemia   Arthritis Paternal Grandmother     SOCIAL HISTORY: Social History   Socioeconomic History   Marital status: Single    Spouse name: Not on file   Number of children: 1   Years of education: Not on file   Highest education level: High school graduate  Occupational History   Not on file  Tobacco Use   Smoking status: Never   Smokeless tobacco: Never  Vaping Use   Vaping Use: Never used  Substance and Sexual Activity   Alcohol use: Yes    Alcohol/week: 6.0 standard drinks    Types: 6 Cans of beer per week   Drug use: Not Currently    Types: Marijuana    Comment: 12/03/20 none   Sexual activity: Yes    Birth control/protection: Surgical  Other Topics Concern   Not on file  Social History Narrative   Not on file   Social Determinants of Health   Financial Resource Strain: Not on file  Food Insecurity: Not on file  Transportation Needs: Not on file  Physical Activity: Not on file  Stress: Not on file  Social Connections: Not on file  Intimate Partner Violence: Not on file     PHYSICAL EXAM  GENERAL EXAM/CONSTITUTIONAL: Vitals:  Vitals:   12/03/20 1055  BP: 116/75  Pulse: 77  Weight: 209 lb (94.8 kg)  Height: 5\' 9"  (1.753 m)   Body mass index is 30.86 kg/m. Wt Readings from Last 3 Encounters:  12/03/20 209 lb (94.8 kg)  09/26/20 202 lb (91.6 kg)  09/11/19 202 lb (91.6 kg)   Patient is in no distress; well developed, nourished and groomed; neck is supple  CARDIOVASCULAR: Examination of carotid arteries is normal; no carotid bruits Regular rate and rhythm, no murmurs Examination of peripheral vascular system by observation and palpation is normal  EYES: Ophthalmoscopic exam of optic discs and posterior segments is normal; no papilledema or hemorrhages No results found.  MUSCULOSKELETAL: Gait,  strength, tone, movements noted in Neurologic exam below  NEUROLOGIC: MENTAL STATUS:  No flowsheet data found. awake, alert, oriented to person, place and time recent and remote memory intact normal attention and concentration language fluent, comprehension intact, naming intact fund of knowledge appropriate  CRANIAL NERVE:  2nd - no papilledema on fundoscopic exam 2nd, 3rd, 4th, 6th - pupils equal and reactive to light, visual fields full to confrontation, extraocular muscles intact, no nystagmus 5th - facial sensation symmetric 7th - facial strength symmetric 8th - hearing intact 9th - palate elevates symmetrically, uvula midline 11th - shoulder shrug symmetric 12th - tongue protrusion midline  MOTOR:  normal bulk and tone, full strength in the BUE, BLE; EXCEPT LEFT HIP FLEXION LIMITED (4) BY PAIN  SENSORY:  normal and symmetric to light touch, pinprick, temperature, vibration  COORDINATION:  finger-nose-finger, fine finger movements normal  REFLEXES:  deep tendon reflexes 1+ and symmetric  GAIT/STATION:  narrow based gait; SLOW AND ANTALGIC GAIT     DIAGNOSTIC DATA (LABS, IMAGING, TESTING) - I reviewed patient records, labs, notes, testing and imaging myself where available.  Lab Results  Component Value Date   HGB 14.3 01/06/2011   HCT 42.0 01/06/2011      Component Value Date/Time   NA 140 01/06/2011 2102   K 2.9 (L) 01/06/2011 2102   CL 103 01/06/2011 2102   GLUCOSE 122 (H) 01/06/2011 2102   BUN 23 01/06/2011 2102   CREATININE 1.30 01/06/2011 2102   No results found for: CHOL, HDL, LDLCALC, LDLDIRECT, TRIG, CHOLHDL No results found for: 2103 No results found for: VITAMINB12 No results found for: TSH    01/06/2011 CT cervical spine 1.  Question of nondisplaced fracture through the left  anterolateral first rib, with scattered associated soft tissue air.  Given the increased prevalence of mediastinal injury with first rib  fractures, CT of the  chest could be considered for further  evaluation, as deemed clinically appropriate.  2.  No evidence of fracture or subluxation along the cervical  spine.  3.  Mild patchy opacity at the left lung apex raises concern for  mild pulmonary parenchymal contusion.    01/06/11 CT thoracic spine [I reviewed images myself and agree with interpretation. -VRP]  - T10, T11 and T12 vertebral body fractures with minimal compression.  No involvement of the posterior  elements.  No retropulsion.   01/06/2011 CT lumbar spine IMPRESSION:  No evidence of fracture or subluxation along the lumbar spine.    10/10/20 MRI lumbar spine [I reviewed images myself and agree with interpretation. -VRP]  - Mild degenerative changes of the lumbar spine without significant spinal canal or neural foraminal stenosis at any level.    ASSESSMENT AND PLAN  45 y.o. year old male here with:  Dx:  1. Chronic left-sided low back pain with left-sided sciatica   2. Compression fracture of T10 vertebra, sequela     PLAN:  POST-TRAUMATIC BACK PAIN / LEFT LEG PAIN (fell from barn in 2012; compression fractures at T10-T12) - consider orthopedic / sports medicine consult for left hip pain - consider PT evaluation - consider pain mgmt referral - continue chiro treatments (has been very helpful in last 3 weeks) - neuro exam notable for symm reflexes and normal sensory exam; strength limited by pain; MRI lumbar spine negative for nerve compression; low back pain and left leg pain symptoms are clearly related to prior trauma in 2012 and maladaptive healing; and therefore EMG/NCS not likely to add helpful information that would change mgmt at this time; patient agrees with plan.  Return for pending if symptoms worsen or fail to improve.    Suanne Marker, MD 12/03/2020, 11:14 AM Certified in Neurology, Neurophysiology and Neuroimaging  [facility] [address], [address] [place], [place]  [address] [phone]

## 2020-12-03 NOTE — Patient Instructions (Signed)
POST-TRAUMATIC BACK PAIN / LEFT LEG PAIN - consider orthopedic / sports medicine consult for left hip pain - consider PT evaluation - consider pain mgmt referral - continue chiro treatments

## 2020-12-04 ENCOUNTER — Encounter: Payer: Self-pay | Admitting: Diagnostic Neuroimaging

## 2021-08-18 IMAGING — MR MR LUMBAR SPINE W/O CM
4 of 5 series · 27 of 48 positions shown · non-contrast
Comparison: Radiographs September 11, 2019.

CLINICAL DATA: Low back pain.

EXAM:
MRI LUMBAR SPINE WITHOUT CONTRAST
TECHNIQUE: Multiplanar, multisequence MR imaging of the lumbar spine was
performed. No intravenous contrast was administered.

[Series 3: T2 · sagittal · 4.0mm · 1.09mm/px · 6 of 17 slices shown (1 of 2)]
[im 1/17]
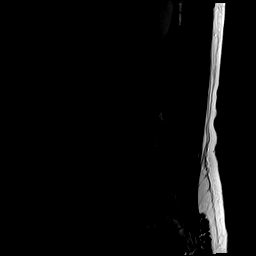
[im 4/17]
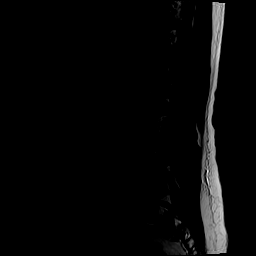
[im 7/17]
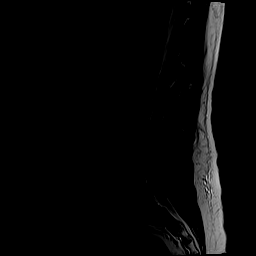
[im 10/17]
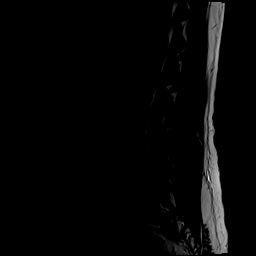
[im 13/17]
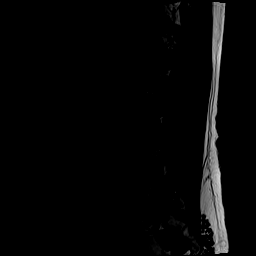
[im 17/17]
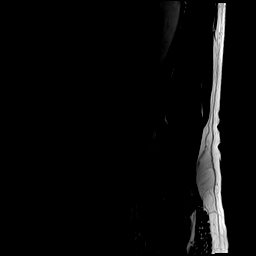

[Series 5: T1 · sagittal · 4.0mm · 1.09mm/px · 6 of 17 slices shown (1 of 2)]
[im 1/17]
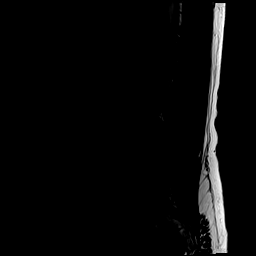
[im 4/17]
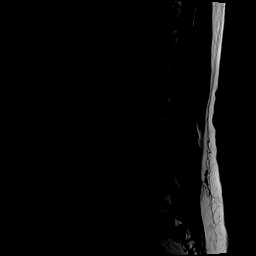
[im 7/17]
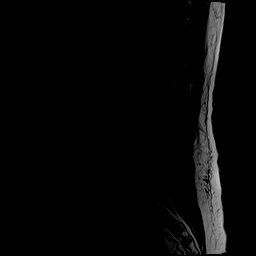
[im 10/17]
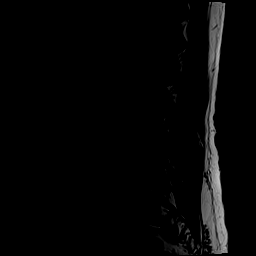
[im 13/17]
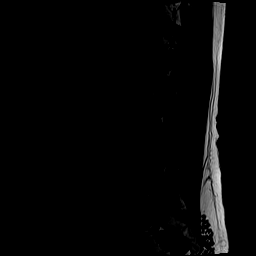
[im 17/17]
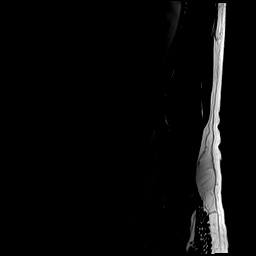

[Series 6: T2 · axial · 4.0mm · 0.39mm/px · z∈[-169,+73]mm · 9 of 46 slices shown (2 of 2)]
[im 1/46]
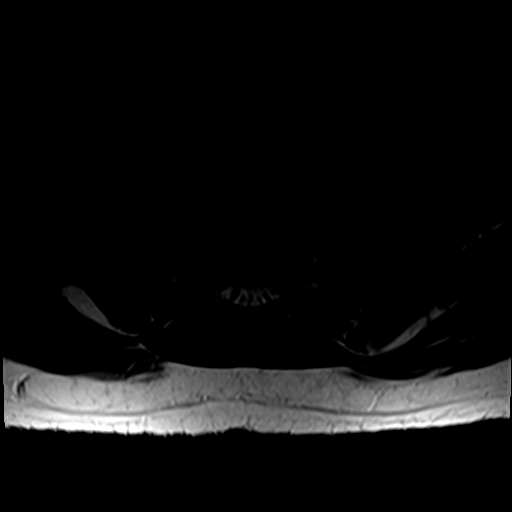
[im 7/46]
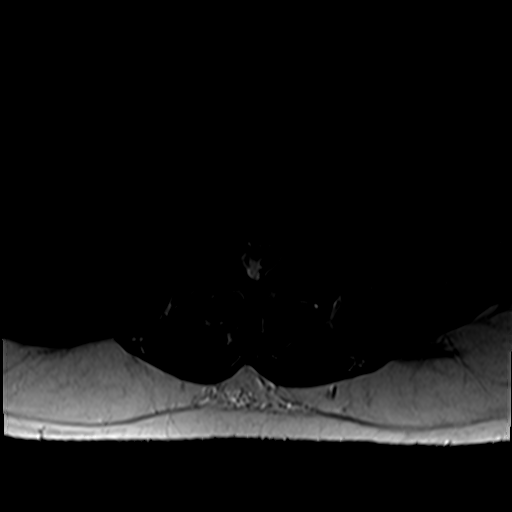
[im 13/46]
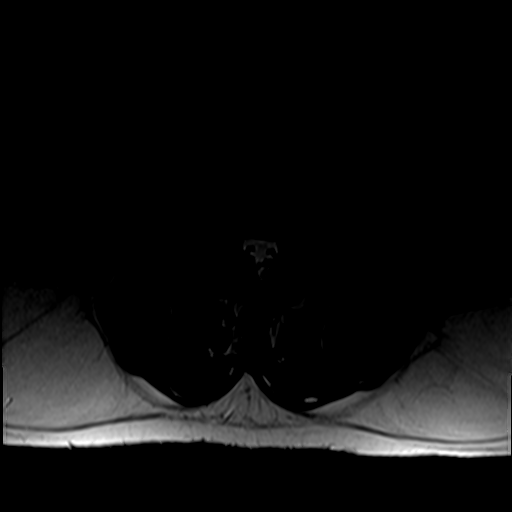
[im 20/46]
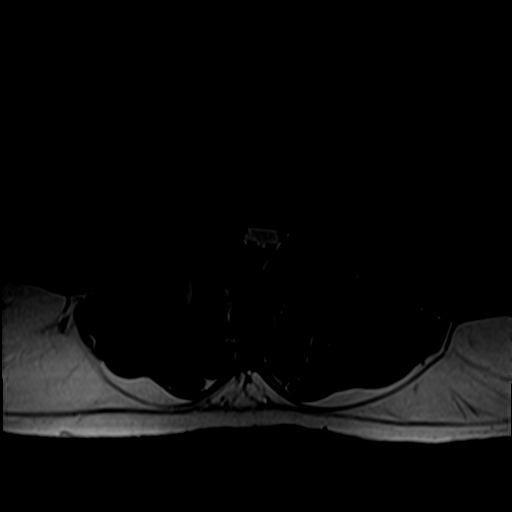
[im 23/46]
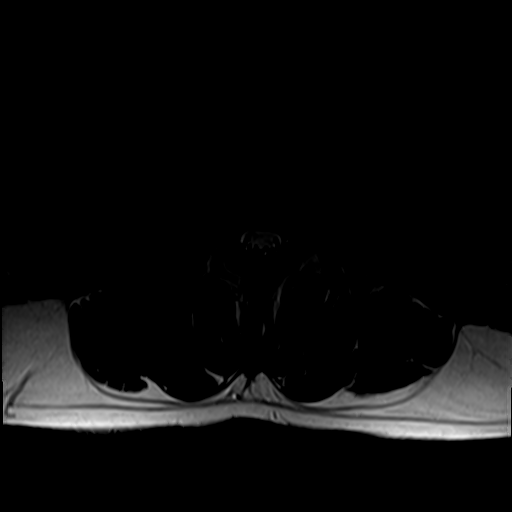
[im 26/46]
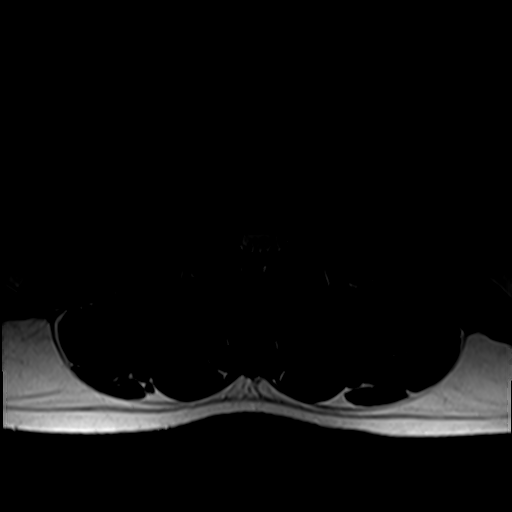
[im 33/46]
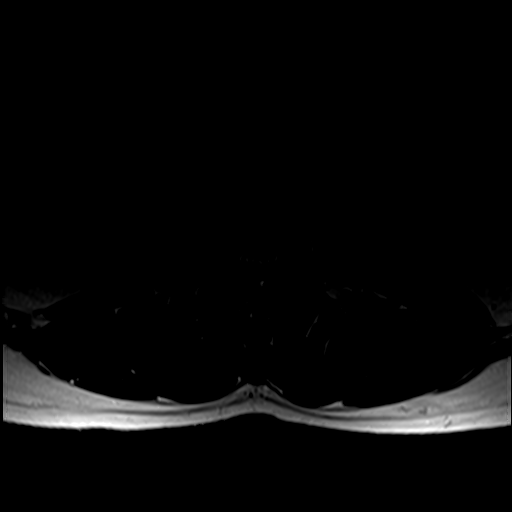
[im 39/46]
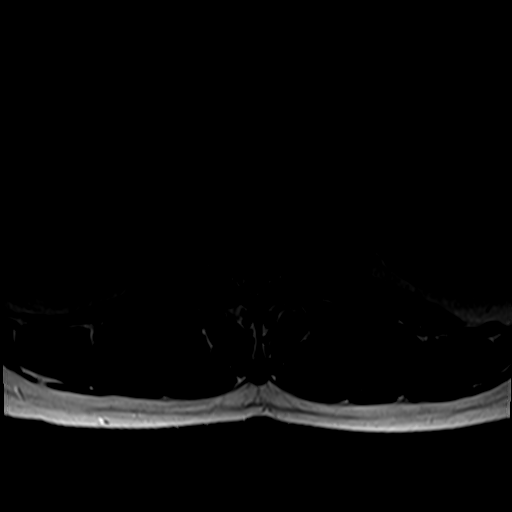
[im 46/46]
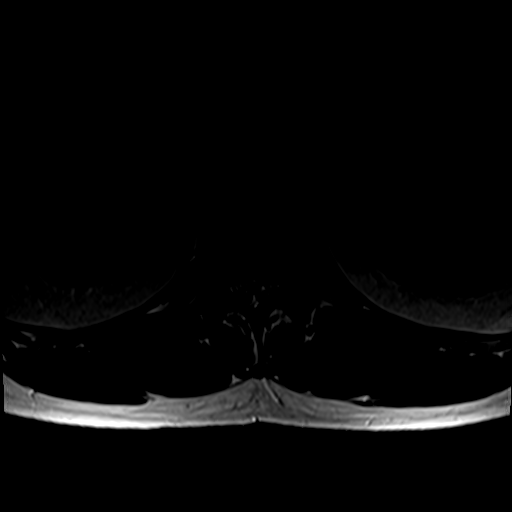

[Series 7: T1 · axial · 4.0mm · 0.39mm/px · z∈[-169,+39]mm · 6 of 46 slices shown (2 of 2)]
[im 1/46]
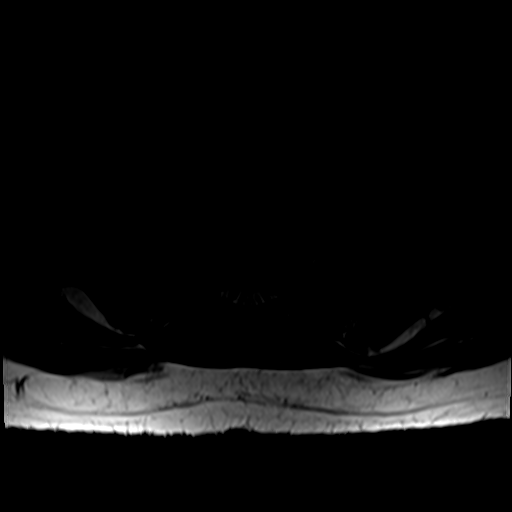
[im 7/46]
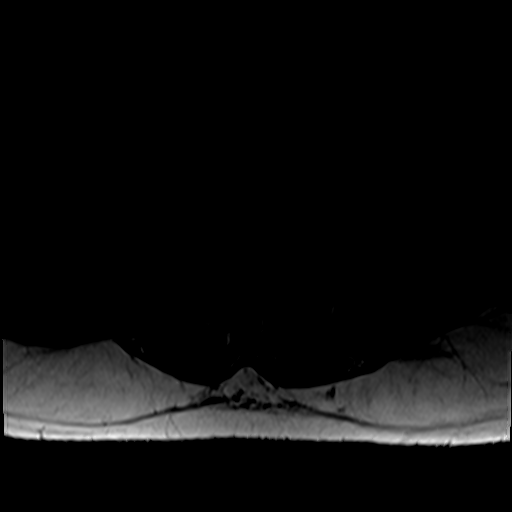
[im 13/46]
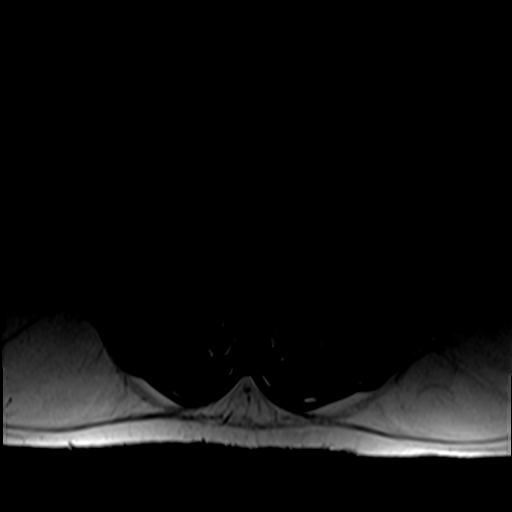
[im 20/46]
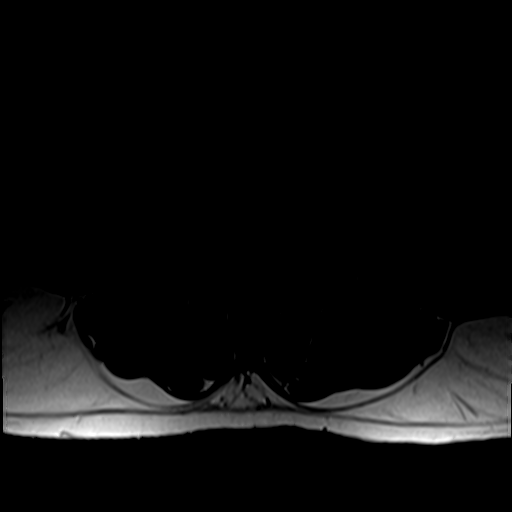
[im 23/46]
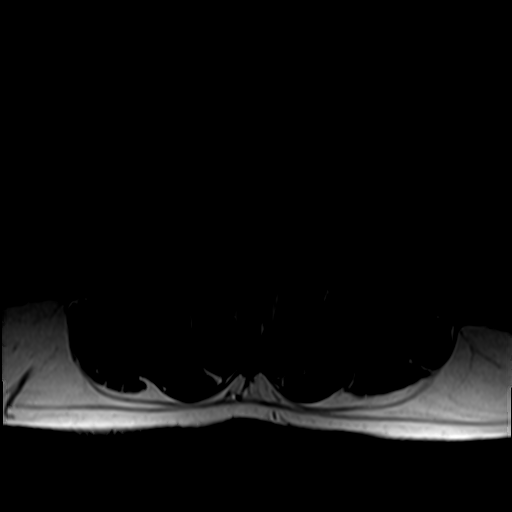
[im 39/46]
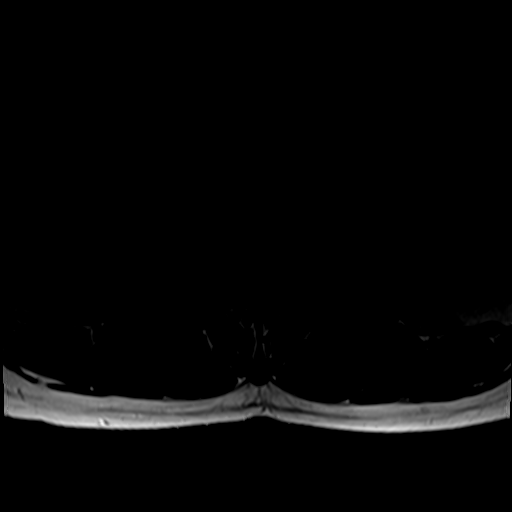

[27 of 48 positions shown; findings below may reference images not displayed]

FINDINGS: Segmentation:  Standard.

Alignment:  Physiologic.

Vertebrae:  No fracture, evidence of discitis, or bone lesion.

Conus medullaris and cauda equina: Conus extends to the T12 level.
Conus and cauda equina appear normal.

Paraspinal and other soft tissues: Negative.

Disc levels:

T12-L1:No spinal canal or neural foraminal stenosis.

L1-2:Mild loss of disc height and shallow disc bulge.No spinal canal
or neural foraminal stenosis.

L2-3:No spinal canal or neural foraminal stenosis.

L3-4:No spinal canal or neural foraminal stenosis.

L4-5:Shallow disc bulge and mild facet degenerative changes.No
spinal canal or neural foraminal stenosis.

L5-S1:Mild facet degenerative changes.No spinal canal or neural
foraminal stenosis.
IMPRESSION: Mild degenerative changes of the lumbar spine without significant
spinal canal or neural foraminal stenosis at any level.

## 2021-08-21 ENCOUNTER — Telehealth: Payer: Self-pay | Admitting: Family Medicine

## 2021-08-21 NOTE — Telephone Encounter (Signed)
Patient left vm this morning on nurses vm. He states he has body aches, diarrhea for the past 2 days and he is down 15 lbs. He would like an antibiotic called into [facility].  ? ?I have lvm asking pt to return my call.  ?

## 2021-08-21 NOTE — Telephone Encounter (Signed)
Need to know about any additional symptoms such as n/v or fevers, recent antibiotics, travel or sick contacts. ? ?LMTRC ?

## 2021-08-26 NOTE — Telephone Encounter (Signed)
LMTRC

## 2021-08-28 NOTE — Telephone Encounter (Signed)
LMTRC

## 2021-08-31 NOTE — Telephone Encounter (Signed)
Call again and left message to returned call

## 2022-10-19 ENCOUNTER — Ambulatory Visit: Payer: Self-pay

## 2022-10-19 NOTE — Telephone Encounter (Signed)
    Chief Complaint: "I have chest discomfort, not really pain sometimes for 2 weeks.My heart wakes me up at night racing." Can last for hours. Symptoms: Above Frequency: 2 weeks Pertinent Negatives: Patient denies any chest pain now. No SOB, Dizziness or nausea. Disposition: [x] ED /[] Urgent Care (no appt availability in office) / [] Appointment(In office/virtual)/ []  [facility]/ [] Home Care/ [] Refused Recommended Disposition /[] [facility]/ []  Follow-up with PCP Additional Notes: Pt. Will have someone take him to ED now.  Reason for Disposition  Patient sounds very sick or weak to the triager  Answer Assessment - Initial Assessment Questions 1. LOCATION: "Where does it hurt?"       Unsure 2. RADIATION: "Does the pain go anywhere else?" (e.g., into neck, jaw, arms, back)     No 3. ONSET: "When did the chest pain begin?" (Minutes, hours or days)      2 weeks 4. PATTERN: "Does the pain come and go, or has it been constant since it started?"  "Does it get worse with exertion?"      Comes and goes 5. DURATION: "How long does it last" (e.g., seconds, minutes, hours)     Lasts hours at times 6. SEVERITY: "How bad is the pain?"  (e.g., Scale 1-10; mild, moderate, or severe)    - MILD (1-3): doesn't interfere with normal activities     - MODERATE (4-7): interferes with normal activities or awakens from sleep    - SEVERE (8-10): excruciating pain, unable to do any normal activities       No pain 7. CARDIAC RISK FACTORS: "Do you have any history of heart problems or risk factors for heart disease?" (e.g., angina, prior heart attack; diabetes, high blood pressure, high cholesterol, smoker, or strong family history of heart disease)     No 8. PULMONARY RISK FACTORS: "Do you have any history of lung disease?"  (e.g., blood clots in lung, asthma, emphysema, birth control pills)     No 9. CAUSE: "What do you think is causing the chest pain?"     Unsure 10. OTHER SYMPTOMS:  "Do you have any other symptoms?" (e.g., dizziness, nausea, vomiting, sweating, fever, difficulty breathing, cough)       Insomnia 11. PREGNANCY: "Is there any chance you are pregnant?" "When was your last menstrual period?"       N/A  Protocols used: Chest Pain-A-AH

## 2022-10-29 ENCOUNTER — Encounter: Payer: Self-pay | Admitting: Family Medicine

## 2022-10-29 ENCOUNTER — Ambulatory Visit (INDEPENDENT_AMBULATORY_CARE_PROVIDER_SITE_OTHER): Payer: Commercial Managed Care - PPO | Admitting: Family Medicine

## 2022-10-29 VITALS — BP 118/62 | HR 64 | Temp 97.7°F | Ht 69.0 in | Wt 213.0 lb

## 2022-10-29 DIAGNOSIS — R002 Palpitations: Secondary | ICD-10-CM

## 2022-10-29 DIAGNOSIS — R079 Chest pain, unspecified: Secondary | ICD-10-CM

## 2022-10-29 LAB — CBC WITH DIFFERENTIAL/PLATELET
Basophils Relative: 1 %
Eosinophils Absolute: 83 cells/uL (ref 15–500)
Eosinophils Relative: 1.2 %
HCT: 42.4 % (ref 38.5–50.0)
MPV: 9.9 fL (ref 7.5–12.5)
Platelets: 331 10*3/uL (ref 140–400)
RBC: 4.99 10*6/uL (ref 4.20–5.80)
RDW: 13 % (ref 11.0–15.0)
Total Lymphocyte: 33.9 %
WBC: 6.9 10*3/uL (ref 3.8–10.8)

## 2022-10-29 MED ORDER — HYDROXYZINE PAMOATE 25 MG PO CAPS: 25.0000 mg | ORAL_CAPSULE | Freq: Three times a day (TID) | ORAL | 0 refills | Status: AC | PRN

## 2022-10-29 NOTE — Progress Notes (Signed)
Subjective:    Patient ID: Gary Cook, male    DOB: 1975/07/04, 47 y.o.   MRN: [id number]  Chest Pain     Patient not sleeping well.  Recently has been experiencing racing heart rate at night.  Consuming caffeine and energy drinks.  Denies CP, SOB, syncope.  FH of a fib in father.  Reports fast heart beat for 1 hour until he fell asleep.  NOw doing well. IN NSR.  Our EKG is broken in office today.  No past medical history on file. Past Surgical History:  Procedure Laterality Date   VASECTOMY     Current Outpatient Medications on File Prior to Visit  Medication Sig Dispense Refill   gabapentin (NEURONTIN) 300 MG capsule Take 1 capsule (300 mg total) by mouth 3 (three) times daily. 90 capsule 3   HYDROcodone-acetaminophen (NORCO) 5-325 MG tablet Take 1 tablet by mouth every 6 (six) hours as needed for moderate pain. 30 tablet 0   meloxicam (MOBIC) 15 MG tablet Take 1 tablet (15 mg total) by mouth daily. 30 tablet 0   Multiple Vitamin (MULTIVITAMIN WITH MINERALS) TABS tablet Take 1 tablet by mouth daily.     No current facility-administered medications on file prior to visit.   No Known Allergies Social History   Socioeconomic History   Marital status: Single    Spouse name: Not on file   Number of children: 1   Years of education: Not on file   Highest education level: High school graduate  Occupational History   Not on file  Tobacco Use   Smoking status: Never   Smokeless tobacco: Never  Vaping Use   Vaping status: Never Used  Substance and Sexual Activity   Alcohol use: Yes    Alcohol/week: 6.0 standard drinks of alcohol    Types: 6 Cans of beer per week   Drug use: Not Currently    Types: Marijuana    Comment: 12/03/20 none   Sexual activity: Yes    Birth control/protection: Surgical  Other Topics Concern   Not on file  Social History Narrative   Not on file   Social Determinants of Health   Financial Resource Strain: Not on file  Food Insecurity: Not on file   Transportation Needs: Not on file  Physical Activity: Not on file  Stress: Not on file  Social Connections: Not on file  Intimate Partner Violence: Not on file     Review of Systems  Cardiovascular:  Positive for chest pain.  All other systems reviewed and are negative.      Objective:   Physical Exam Constitutional:      Appearance: He is well-developed and normal weight.  Cardiovascular:     Heart sounds: Normal heart sounds. Heart sounds not distant.  Pulmonary:     Effort: Pulmonary effort is normal. No tachypnea, accessory muscle usage or respiratory distress.     Breath sounds: Normal breath sounds. No stridor. No decreased breath sounds, wheezing, rhonchi or rales.  Musculoskeletal:     Right lower leg: No edema.     Left lower leg: No edema.  Neurological:     General: No focal deficit present.     Mental Status: He is alert and oriented to person, place, and time.     Cranial Nerves: No cranial nerve deficit.     Motor: No weakness.  Psychiatric:        Mood and Affect: Mood normal. Mood is not anxious.  Behavior: Behavior normal. Behavior is not agitated.           Assessment & Plan:   Chest pain, unspecified type - Plan: EKG 12-Lead  Palpitations - Plan: LONG TERM MONITOR (3-14 DAYS), CBC with Differential/Platelet, COMPLETE METABOLIC PANEL WITH GFR, TSH Get Zio monitor to rule out SVT or paroxysmal atrial fibrillation.  Suspect stress and anxiety induced PVC/PAC's are the cause.  Try hydroxyzine 25 mg poqhs prn insomnia.  Reduce stimulants.  Get CBC, TSH, CMP

## 2022-10-30 LAB — COMPLETE METABOLIC PANEL WITH GFR
AG Ratio: 2 (calc) (ref 1.0–2.5)
ALT: 34 U/L (ref 9–46)
AST: 23 U/L (ref 10–40)
Albumin: 4.5 g/dL (ref 3.6–5.1)
Alkaline phosphatase (APISO): 100 U/L (ref 36–130)
BUN: 21 mg/dL (ref 7–25)
CO2: 25 mmol/L (ref 20–32)
Calcium: 9.4 mg/dL (ref 8.6–10.3)
Chloride: 103 mmol/L (ref 98–110)
Creat: 1.05 mg/dL (ref 0.60–1.29)
Globulin: 2.2 g/dL (calc) (ref 1.9–3.7)
Glucose, Bld: 102 mg/dL — ABNORMAL HIGH (ref 65–99)
Potassium: 3.8 mmol/L (ref 3.5–5.3)
Sodium: 139 mmol/L (ref 135–146)
Total Bilirubin: 0.8 mg/dL (ref 0.2–1.2)
Total Protein: 6.7 g/dL (ref 6.1–8.1)
eGFR: 90 mL/min/{1.73_m2} (ref >=60)

## 2022-10-30 LAB — CBC WITH DIFFERENTIAL/PLATELET
Absolute Monocytes: 497 cells/uL (ref 200–950)
Basophils Absolute: 69 cells/uL (ref 0–200)
Hemoglobin: 14.3 g/dL (ref 13.2–17.1)
Lymphs Abs: 2339 cells/uL (ref 850–3900)
MCH: 28.7 pg (ref 27.0–33.0)
MCHC: 33.7 g/dL (ref 32.0–36.0)
MCV: 85 fL (ref 80.0–100.0)
Monocytes Relative: 7.2 %
Neutro Abs: 3912 cells/uL (ref 1500–7800)
Neutrophils Relative %: 56.7 %

## 2022-10-30 LAB — TSH: TSH: 1.23 mIU/L (ref 0.40–4.50)

## 2022-11-19 ENCOUNTER — Telehealth: Payer: Self-pay

## 2022-11-19 NOTE — Telephone Encounter (Signed)
Pt called to advise that he never received Zio monitor. Message sent to [facility] to investigate.  Pt also asks if you have any suggestions on how to lose weight since his hip surgery? Thanks.

## 2022-11-22 ENCOUNTER — Ambulatory Visit: Payer: Commercial Managed Care - PPO | Attending: Family Medicine

## 2022-11-22 ENCOUNTER — Other Ambulatory Visit: Payer: Self-pay

## 2022-11-22 DIAGNOSIS — R079 Chest pain, unspecified: Secondary | ICD-10-CM

## 2022-11-22 DIAGNOSIS — R002 Palpitations: Secondary | ICD-10-CM

## 2022-11-22 DIAGNOSIS — Z6831 Body mass index (BMI) 31.0-31.9, adult: Secondary | ICD-10-CM

## 2022-11-22 MED ORDER — WEGOVY 0.5 MG/0.5ML ~~LOC~~ SOAJ
0.5000 mg | SUBCUTANEOUS | 1 refills | Status: AC
Start: 2022-11-22 — End: ?

## 2022-11-22 NOTE — Progress Notes (Unsigned)
Enrolled for [employer] to mail a ZIO XT long term holter monitor to the patients address on file.   DOD to read. 

## 2022-12-15 ENCOUNTER — Telehealth: Payer: Self-pay

## 2022-12-15 NOTE — Telephone Encounter (Signed)
PA FOR WEGOVY: PT ADVISED.  There was an error with your request Cannot find matching patient with Name and Date of Birth provided. For additional information, please contact the phone number on the back of the member prescription ID card.

## 2022-12-15 NOTE — Telephone Encounter (Signed)
PA FOR WEGOVY SENT. MJP,LPN  Cadell Speros (Key: [id number])   [facility] is processing your PA request and will respond shortly with next steps. You Matto close this dialog, return to your dashboard, and perform other tasks. To check for an update later, open this request again from your dashboard.  If you need assistance, please chat with CoverMyMeds or call us at [phone].

## 2023-08-15 ENCOUNTER — Telehealth: Payer: Self-pay

## 2023-08-15 NOTE — Telephone Encounter (Signed)
 Copied from CRM [id number]. Topic: Clinical - Medical Advice >> Almond 5, 2025  3:55 PM Lotus Round B wrote: Reason for CRM: pt called in to see if Dr.Pickard or his nurse can send something to the pharmacy for shingles he said they are very itchy and burn a little . If someone can give him a call when they can he said that would be great
